# Patient Record
Sex: Male | Born: 1990 | Race: White | Hispanic: No | Marital: Married | Smoking: Never smoker
Health system: Southern US, Community
[De-identification: ages and names within clinical notes are randomized; demographics above are authoritative.]

---

## 2019-02-25 ENCOUNTER — Other Ambulatory Visit: Payer: Self-pay | Admitting: Family Medicine

## 2019-02-25 ENCOUNTER — Other Ambulatory Visit: Payer: Self-pay

## 2019-02-25 ENCOUNTER — Ambulatory Visit: Payer: Self-pay

## 2019-02-25 DIAGNOSIS — Z0000 Encounter for general adult medical examination without abnormal findings: Secondary | ICD-10-CM

## 2019-12-08 DIAGNOSIS — Z0001 Encounter for general adult medical examination with abnormal findings: Secondary | ICD-10-CM | POA: Diagnosis not present

## 2020-07-21 DIAGNOSIS — S61212A Laceration without foreign body of right middle finger without damage to nail, initial encounter: Secondary | ICD-10-CM | POA: Diagnosis not present

## 2020-07-21 DIAGNOSIS — K219 Gastro-esophageal reflux disease without esophagitis: Secondary | ICD-10-CM | POA: Diagnosis not present

## 2020-07-21 DIAGNOSIS — Z0000 Encounter for general adult medical examination without abnormal findings: Secondary | ICD-10-CM | POA: Diagnosis not present

## 2020-07-21 DIAGNOSIS — Z23 Encounter for immunization: Secondary | ICD-10-CM | POA: Diagnosis not present

## 2020-07-21 DIAGNOSIS — Z0289 Encounter for other administrative examinations: Secondary | ICD-10-CM | POA: Diagnosis not present

## 2020-07-26 DIAGNOSIS — Z111 Encounter for screening for respiratory tuberculosis: Secondary | ICD-10-CM | POA: Diagnosis not present

## 2020-08-10 DIAGNOSIS — Z111 Encounter for screening for respiratory tuberculosis: Secondary | ICD-10-CM | POA: Diagnosis not present

## 2020-09-14 DIAGNOSIS — Z23 Encounter for immunization: Secondary | ICD-10-CM | POA: Diagnosis not present

## 2020-10-13 DIAGNOSIS — Z23 Encounter for immunization: Secondary | ICD-10-CM | POA: Diagnosis not present

## 2021-04-11 DIAGNOSIS — Z0000 Encounter for general adult medical examination without abnormal findings: Secondary | ICD-10-CM | POA: Diagnosis not present

## 2021-04-11 DIAGNOSIS — E785 Hyperlipidemia, unspecified: Secondary | ICD-10-CM | POA: Diagnosis not present

## 2021-04-13 DIAGNOSIS — Z0001 Encounter for general adult medical examination with abnormal findings: Secondary | ICD-10-CM | POA: Diagnosis not present

## 2021-04-13 DIAGNOSIS — Z23 Encounter for immunization: Secondary | ICD-10-CM | POA: Diagnosis not present

## 2021-06-15 DIAGNOSIS — J069 Acute upper respiratory infection, unspecified: Secondary | ICD-10-CM | POA: Diagnosis not present

## 2021-06-29 ENCOUNTER — Other Ambulatory Visit: Payer: Self-pay

## 2021-06-29 ENCOUNTER — Emergency Department (HOSPITAL_BASED_OUTPATIENT_CLINIC_OR_DEPARTMENT_OTHER): Payer: BC Managed Care – PPO

## 2021-06-29 ENCOUNTER — Emergency Department (HOSPITAL_BASED_OUTPATIENT_CLINIC_OR_DEPARTMENT_OTHER)
Admission: EM | Admit: 2021-06-29 | Discharge: 2021-06-29 | Disposition: A | Discharge status: Home / Self Care | Payer: BC Managed Care – PPO | Attending: Emergency Medicine | Admitting: Emergency Medicine

## 2021-06-29 ENCOUNTER — Encounter (HOSPITAL_BASED_OUTPATIENT_CLINIC_OR_DEPARTMENT_OTHER): Payer: Self-pay | Admitting: Radiology

## 2021-06-29 DIAGNOSIS — K529 Noninfective gastroenteritis and colitis, unspecified: Secondary | ICD-10-CM | POA: Insufficient documentation

## 2021-06-29 DIAGNOSIS — R1110 Vomiting, unspecified: Secondary | ICD-10-CM | POA: Diagnosis not present

## 2021-06-29 DIAGNOSIS — R1031 Right lower quadrant pain: Secondary | ICD-10-CM | POA: Diagnosis not present

## 2021-06-29 LAB — CBC WITH DIFFERENTIAL/PLATELET
Abs Immature Granulocytes: 0.08 10*3/uL — ABNORMAL HIGH (ref 0.00–0.07)
Basophils Absolute: 0.1 10*3/uL (ref 0.0–0.1)
Basophils Relative: 0 %
Eosinophils Absolute: 0 10*3/uL (ref 0.0–0.5)
Eosinophils Relative: 0 %
HCT: 48.6 % (ref 39.0–52.0)
Hemoglobin: 16.6 g/dL (ref 13.0–17.0)
Immature Granulocytes: 1 %
Lymphocytes Relative: 4 %
Lymphs Abs: 0.7 10*3/uL (ref 0.7–4.0)
MCH: 28.7 pg (ref 26.0–34.0)
MCHC: 34.2 g/dL (ref 30.0–36.0)
MCV: 84.1 fL (ref 80.0–100.0)
Monocytes Absolute: 0.9 10*3/uL (ref 0.1–1.0)
Monocytes Relative: 6 %
Neutro Abs: 14.6 10*3/uL — ABNORMAL HIGH (ref 1.7–7.7)
Neutrophils Relative %: 89 %
Platelets: 345 10*3/uL (ref 150–400)
RBC: 5.78 MIL/uL (ref 4.22–5.81)
RDW: 12.5 % (ref 11.5–15.5)
WBC: 16.3 10*3/uL — ABNORMAL HIGH (ref 4.0–10.5)
nRBC: 0 % (ref 0.0–0.2)

## 2021-06-29 LAB — COMPREHENSIVE METABOLIC PANEL
ALT: 29 U/L (ref 0–44)
AST: 27 U/L (ref 15–41)
Albumin: 4.7 g/dL (ref 3.5–5.0)
Alkaline Phosphatase: 61 U/L (ref 38–126)
Anion gap: 10 (ref 5–15)
BUN: 16 mg/dL (ref 6–20)
CO2: 25 mmol/L (ref 22–32)
Calcium: 9.3 mg/dL (ref 8.9–10.3)
Chloride: 102 mmol/L (ref 98–111)
Creatinine, Ser: 1.06 mg/dL (ref 0.61–1.24)
GFR, Estimated: >60 mL/min (ref >60)
Glucose, Bld: 136 mg/dL — ABNORMAL HIGH (ref 70–99)
Potassium: 4.4 mmol/L (ref 3.5–5.1)
Sodium: 137 mmol/L (ref 135–145)
Total Bilirubin: 0.7 mg/dL (ref 0.3–1.2)
Total Protein: 7.9 g/dL (ref 6.5–8.1)

## 2021-06-29 LAB — LIPASE, BLOOD: Lipase: 21 U/L (ref 11–51)

## 2021-06-29 MED ORDER — ONDANSETRON HCL 4 MG/2ML IJ SOLN
4.0000 mg | Freq: Once | INTRAMUSCULAR | Status: AC
  Administered 2021-06-29: 4 mg via INTRAVENOUS
  Filled 2021-06-29: qty 2

## 2021-06-29 MED ORDER — SODIUM CHLORIDE 0.9 % IV BOLUS
1000.0000 mL | Freq: Once | INTRAVENOUS | Status: AC
  Administered 2021-06-29: 1000 mL via INTRAVENOUS

## 2021-06-29 MED ORDER — ONDANSETRON 8 MG PO TBDP: 8.0000 mg | ORAL_TABLET | Freq: Three times a day (TID) | ORAL | 0 refills | Status: AC | PRN

## 2021-06-29 MED ORDER — IOHEXOL 300 MG/ML  SOLN
100.0000 mL | Freq: Once | INTRAMUSCULAR | Status: AC | PRN
  Administered 2021-06-29: 100 mL via INTRAVENOUS

## 2021-06-29 MED ORDER — FENTANYL CITRATE PF 50 MCG/ML IJ SOSY
50.0000 ug | PREFILLED_SYRINGE | Freq: Once | INTRAMUSCULAR | Status: AC
  Administered 2021-06-29: 50 ug via INTRAVENOUS
  Filled 2021-06-29: qty 1

## 2021-06-29 NOTE — ED Triage Notes (Signed)
Pt reports sudden onset of N/V/D around 2300 with associated 7/10 RLQ pain. No surgical hx. VSS, ambulatory, A&Ox3.

## 2021-06-29 NOTE — Discharge Instructions (Signed)
Take loperamide (Imodium AD) as needed for diarrhea.  Return if symptoms are getting worse. 

## 2021-06-29 NOTE — ED Provider Notes (Signed)
[facility] EMERGENCY DEPT Provider Note   CSN: [id number] Arrival date & time: 06/29/21  0217     History  Chief Complaint  Patient presents with   Abdominal Pain    Curtis Barnes is a 31 y.o. male.  The history is provided by the patient.  Abdominal Pain He has no significant past medical history and comes in because of vomiting, diarrhea, right lower quadrant pain.  He was well until about 11:30 PM when he started having diarrhea followed by vomiting.  He has to meats and episodes of emesis and needed episodes of diarrhea.  There was no blood or mucus in stool or emesis.  He denies fever or sweats but has had chills.  Over the last 2 hours, he has developed right lower quadrant pain which is moderately severe.  He rates it at 6/10.  Pain does not radiate.  He did notice that pain was worse if the car hit a pothole while driving to the hospital.   Home Medications Prior to Admission medications   Not on File      Allergies    Patient has no known allergies.    Review of Systems   Review of Systems  Gastrointestinal:  Positive for abdominal pain.  All other systems reviewed and are negative.  Physical Exam Updated Vital Signs BP 116/79    Pulse 99    Temp 98.3 F (36.8 C) (Oral)    Resp 15    Ht 5\' 10"  (1.778 m)    Wt 83.9 kg    SpO2 96%    BMI 26.54 kg/m  Physical Exam Vitals and nursing note reviewed.  31 year old male, resting comfortably and in no acute distress. Vital signs are normal. Oxygen saturation is 96%, which is normal. Head is normocephalic and atraumatic. PERRLA, EOMI. Oropharynx is clear. Neck is nontender and supple without adenopathy or JVD. Back is nontender and there is no CVA tenderness. Lungs are clear without rales, wheezes, or rhonchi. Chest is nontender. Heart has regular rate and rhythm without murmur. Abdomen is soft, flat, with moderate right lower quadrant tenderness.  There is no rebound or guarding.  There are no masses or  hepatosplenomegaly and peristalsis is hypoactive. Extremities have no cyanosis or edema, full range of motion is present. Skin is warm and dry without rash. Neurologic: Mental status is normal, cranial nerves are intact, there are no motor or sensory deficits.  ED Results / Procedures / Treatments   Labs (all labs ordered are listed, but only abnormal results are displayed) Labs Reviewed  CBC WITH DIFFERENTIAL/PLATELET - Abnormal; Notable for the following components:      Result Value   WBC 16.3 (*)    Neutro Abs 14.6 (*)    Abs Immature Granulocytes 0.08 (*)    All other components within normal limits  COMPREHENSIVE METABOLIC PANEL - Abnormal; Notable for the following components:   Glucose, Bld 136 (*)    All other components within normal limits  LIPASE, BLOOD   Radiology CT ABDOMEN PELVIS W CONTRAST  Result Date: 06/29/2021 CLINICAL DATA:  31 year old male with sudden onset nausea vomiting and diarrhea 2300 hours with right lower quadrant pain. EXAM: CT ABDOMEN AND PELVIS WITH CONTRAST TECHNIQUE: Multidetector CT imaging of the abdomen and pelvis was performed using the standard protocol following bolus administration of intravenous contrast. CONTRAST:  26 OMNIPAQUE IOHEXOL 300 MG/ML  SOLN COMPARISON:  Chest radiographs 02/25/2019. FINDINGS: Lower chest: Minor lung base atelectasis, otherwise negative. Hepatobiliary: Negative  liver and gallbladder. Pancreas: Negative. Spleen: Small round partially exophytic low-density lesion of the superior pole of the spleen best seen on coronal image 34 has simple fluid density. This measures about 11 mm. Favor benign etiology such as hemangioma or cyst. Otherwise the spleen is within normal limits. Adrenals/Urinary Tract: Normal adrenal glands. Kidneys appears symmetric, nonobstructed and without pararenal inflammation. No nephrolithiasis. Decompressed ureters. Unremarkable bladder. There are few phleboliths in the deep left pelvis. Stomach/Bowel:  Fluid in nondilated small and large bowel loops throughout the abdomen and pelvis. Fluid in the colon to the level of the rectum. Normal appendix on series 2, image 67. No discrete large or small bowel inflammation. Unremarkable stomach and duodenum. No free air or free fluid. Vascular/Lymphatic: Major arterial structures appear patent and normal. Portal venous system is patent. No lymphadenopathy. Reproductive: Negative. Other: No pelvic free fluid. Musculoskeletal: Slight dextroconvex scoliosis, otherwise negative. IMPRESSION: 1. Fluid in nondilated small and large bowel loops throughout the abdomen and pelvis compatible with enteritis and/or diarrhea. No discrete bowel inflammation. Normal appendix. 2. No other acute or inflammatory process identified in the abdomen or pelvis. 3. A small round low-density lesion of the superior pole of the spleen has simple fluid density and is most likely benign. Electronically Signed   By: Odessa Fleming M.D.   On: 06/29/2021 04:10    Procedures Procedures    Medications Ordered in ED Medications  sodium chloride 0.9 % bolus 1,000 mL (0 mLs Intravenous Stopped 06/29/21 0341)  ondansetron (ZOFRAN) injection 4 mg (4 mg Intravenous Given 06/29/21 0244)  fentaNYL (SUBLIMAZE) injection 50 mcg (50 mcg Intravenous Given 06/29/21 0246)  iohexol (OMNIPAQUE) 300 MG/ML solution 100 mL (100 mLs Intravenous Contrast Given 06/29/21 0350)    ED Course/ Medical Decision Making/ A&P                           Medical Decision Making  Right lower quadrant pain in the setting of nausea, vomiting, diarrhea.  Possible gastroenteritis, possible food poisoning, possible appendicitis.  Doubt pyelonephritis, urolithiasis.  He has received fentanyl and ondansetron with improvement in pain and nausea.  WBC is elevated at 16.3 with left shift, but this is nonspecific.  Chest x-ray is normal.  I have independently viewed the images and agree with radiologist interpretation.  Metabolic panel is pending.   He will be sent for CT of abdomen and pelvis.  Old records are reviewed, and he has no relevant past visits.  Metabolic panel is significant for borderline elevated glucose.  Lipase is normal.  CT of abdomen and pelvis shows normal appendix, fluid in the distal small bowel and colon consistent with gastroenteritis.  I have independently reviewed the images and agree with radiologist interpretation.  He is discharged with prescription for ondansetron oral dissolving tablet and told to use over-the-counter loperamide as needed for diarrhea.  Return precautions discussed.        Final Clinical Impression(s) / ED Diagnoses Final diagnoses:  Gastroenteritis    Rx / DC Orders ED Discharge Orders          Ordered    ondansetron (ZOFRAN-ODT) 8 MG disintegrating tablet  Every 8 hours PRN        06/29/21 0428              Dione Booze, MD 06/29/21 0430

## 2022-02-02 DIAGNOSIS — T753XXA Motion sickness, initial encounter: Secondary | ICD-10-CM | POA: Diagnosis not present

## 2022-04-12 DIAGNOSIS — E785 Hyperlipidemia, unspecified: Secondary | ICD-10-CM | POA: Diagnosis not present

## 2022-04-16 DIAGNOSIS — Z0000 Encounter for general adult medical examination without abnormal findings: Secondary | ICD-10-CM | POA: Diagnosis not present

## 2022-04-16 DIAGNOSIS — E785 Hyperlipidemia, unspecified: Secondary | ICD-10-CM | POA: Diagnosis not present

## 2022-05-22 DIAGNOSIS — L600 Ingrowing nail: Secondary | ICD-10-CM | POA: Diagnosis not present

## 2022-08-08 ENCOUNTER — Ambulatory Visit (INDEPENDENT_AMBULATORY_CARE_PROVIDER_SITE_OTHER): Payer: BC Managed Care – PPO

## 2022-08-08 ENCOUNTER — Ambulatory Visit
Admission: EM | Admit: 2022-08-08 | Discharge: 2022-08-08 | Disposition: A | Discharge status: Home / Self Care | Payer: BC Managed Care – PPO | Attending: Family Medicine | Admitting: Family Medicine

## 2022-08-08 DIAGNOSIS — R059 Cough, unspecified: Secondary | ICD-10-CM | POA: Diagnosis not present

## 2022-08-08 DIAGNOSIS — J209 Acute bronchitis, unspecified: Secondary | ICD-10-CM

## 2022-08-08 MED ORDER — ALBUTEROL SULFATE (2.5 MG/3ML) 0.083% IN NEBU
2.5000 mg | INHALATION_SOLUTION | Freq: Once | RESPIRATORY_TRACT | Status: AC
  Administered 2022-08-08: 2.5 mg via RESPIRATORY_TRACT

## 2022-08-08 MED ORDER — ALBUTEROL SULFATE HFA 108 (90 BASE) MCG/ACT IN AERS: 2.0000 | INHALATION_SPRAY | RESPIRATORY_TRACT | 0 refills | Status: AC | PRN

## 2022-08-08 MED ORDER — PREDNISONE 20 MG PO TABS
40.0000 mg | ORAL_TABLET | Freq: Every day | ORAL | 0 refills | Status: AC
Start: 2022-08-08 — End: ?

## 2022-08-08 MED ORDER — PROMETHAZINE-DM 6.25-15 MG/5ML PO SYRP: 5.0000 mL | ORAL_SOLUTION | Freq: Four times a day (QID) | ORAL | 0 refills | Status: AC | PRN

## 2022-08-08 NOTE — ED Triage Notes (Addendum)
Pt reports he has had a cough with "rales" and green mucus x 2 weeks. Took decongestants but no relief

## 2022-08-08 NOTE — ED Provider Notes (Signed)
[facility]    CSN: [id number] Arrival date & time: 08/08/22  1211      History   Chief Complaint No chief complaint on file.   HPI Curtis Barnes is a 32 y.o. male.   Patient presenting today with 2-week history of progressively worsening productive cough, chest tightness and feeling like there is crackles in his lungs.  He states they traveled to the [place] and he and his wife have both been sick with similar symptoms since returning back home.  Has tried some over-the-counter decongestants here and there with last dose being several days ago with mild relief of symptoms.  Denies any history of asthma or other chronic pulmonary conditions.    History reviewed. No pertinent past medical history.  There are no problems to display for this patient.   History reviewed. No pertinent surgical history.     Home Medications    Prior to Admission medications   Medication Sig Start Date End Date Taking? Authorizing Provider  albuterol (VENTOLIN HFA) 108 (90 Base) MCG/ACT inhaler Inhale 2 puffs into the lungs every 4 (four) hours as needed for wheezing or shortness of breath. 08/08/22  Yes Volney American, PA-C  predniSONE (DELTASONE) 20 MG tablet Take 2 tablets (40 mg total) by mouth daily with breakfast. 08/08/22  Yes Volney American, PA-C  promethazine-dextromethorphan (PROMETHAZINE-DM) 6.25-15 MG/5ML syrup Take 5 mLs by mouth 4 (four) times daily as needed. 08/08/22  Yes Volney American, PA-C  ondansetron (ZOFRAN-ODT) 8 MG disintegrating tablet Take 1 tablet (8 mg total) by mouth every 8 (eight) hours as needed for nausea or vomiting. [id number]   Delora Fuel, MD    Family History History reviewed. No pertinent family history.  Social History Social History   Tobacco Use   Smoking status: Never   Smokeless tobacco: Never  Substance Use Topics   Alcohol use: Never   Drug use: Never     Allergies   Patient has no known allergies.   Review  of Systems Review of Systems Per HPI  Physical Exam Triage Vital Signs ED Triage Vitals  Enc Vitals Group     BP 08/08/22 1334 120/79     Pulse Rate 08/08/22 1334 89     Resp 08/08/22 1334 18     Temp 08/08/22 1334 97.9 F (36.6 C)     Temp Source 08/08/22 1334 Oral     SpO2 08/08/22 1334 96 %     Weight --      Height --      Head Circumference --      Peak Flow --      Pain Score 08/08/22 1336 0     Pain Loc --      Pain Edu? --      Excl. in [place]? --    No data found.  Updated Vital Signs BP 120/79 (BP Location: Right Arm)   Pulse 94   Temp 97.9 F (36.6 C) (Oral)   Resp 18   SpO2 97%   Visual Acuity Right Eye Distance:   Left Eye Distance:   Bilateral Distance:    Right Eye Near:   Left Eye Near:    Bilateral Near:     Physical Exam Vitals and nursing note reviewed.  Constitutional:      Appearance: He is well-developed.  HENT:     Head: Atraumatic.     Right Ear: External ear normal.     Left Ear: External ear normal.  Nose: Rhinorrhea present.     Mouth/Throat:     Pharynx: No oropharyngeal exudate or posterior oropharyngeal erythema.  Eyes:     Conjunctiva/sclera: Conjunctivae normal.     Pupils: Pupils are equal, round, and reactive to light.  Cardiovascular:     Rate and Rhythm: Normal rate and regular rhythm.  Pulmonary:     Effort: Pulmonary effort is normal. No respiratory distress.     Breath sounds: Wheezing present. No rales.  Musculoskeletal:        General: Normal range of motion.     Cervical back: Normal range of motion and neck supple.  Lymphadenopathy:     Cervical: No cervical adenopathy.  Skin:    General: Skin is warm and dry.  Neurological:     Mental Status: He is alert and oriented to person, place, and time.  Psychiatric:        Behavior: Behavior normal.      UC Treatments / Results  Labs (all labs ordered are listed, but only abnormal results are displayed) Labs Reviewed - No data to  display  EKG   Radiology DG Chest 2 View  Result Date: 08/08/2022 CLINICAL DATA:  Cough worsening for 2 weeks. EXAM: CHEST - 2 VIEW COMPARISON:  Chest radiograph 02/25/2019 FINDINGS: The cardiomediastinal silhouette is normal. There is no focal consolidation or pulmonary edema. There is no pleural effusion or pneumothorax There is no acute osseous abnormality. IMPRESSION: No radiographic evidence of acute cardiopulmonary process. Electronically Signed   By: Valetta Mole M.D.   On: 08/08/2022 14:02    Procedures Procedures (including critical care time)  Medications Ordered in UC Medications  albuterol (PROVENTIL) (2.5 MG/3ML) 0.083% nebulizer solution 2.5 mg (2.5 mg Nebulization Given 08/08/22 1355)    Initial Impression / Assessment and Plan / UC Course  I have reviewed the triage vital signs and the nursing notes.  Pertinent labs & imaging results that were available during my care of the patient were reviewed by me and considered in my medical decision making (see chart for details).     Moderate benefit noted after albuterol nebulizer treatment regarding his wheezing and chest tightness, chest x-ray performed today negative for acute cardiopulmonary abnormality.  Will treat for bronchitis with prednisone, Phenergan DM, albuterol and discussed supportive over-the-counter medications and home care.  Return for any worsening symptoms.  Final Clinical Impressions(s) / UC Diagnoses   Final diagnoses:  Acute bronchitis, unspecified organism   Discharge Instructions   None    ED Prescriptions     Medication Sig Dispense Auth. Provider   predniSONE (DELTASONE) 20 MG tablet Take 2 tablets (40 mg total) by mouth daily with breakfast. 10 tablet Volney American, PA-C   promethazine-dextromethorphan (PROMETHAZINE-DM) 6.25-15 MG/5ML syrup Take 5 mLs by mouth 4 (four) times daily as needed. 100 mL Volney American, PA-C   albuterol (VENTOLIN HFA) 108 (90 Base) MCG/ACT  inhaler Inhale 2 puffs into the lungs every 4 (four) hours as needed for wheezing or shortness of breath. 18 g Volney American, [place]      PDMP not reviewed this encounter.   Volney American, [place] 08/08/22 1531

## 2023-04-08 DIAGNOSIS — E785 Hyperlipidemia, unspecified: Secondary | ICD-10-CM | POA: Diagnosis not present

## 2023-04-19 DIAGNOSIS — E785 Hyperlipidemia, unspecified: Secondary | ICD-10-CM | POA: Diagnosis not present

## 2023-04-19 DIAGNOSIS — Z800 Family history of malignant neoplasm of digestive organs: Secondary | ICD-10-CM | POA: Diagnosis not present

## 2023-04-19 DIAGNOSIS — Z0000 Encounter for general adult medical examination without abnormal findings: Secondary | ICD-10-CM | POA: Diagnosis not present

## 2023-04-19 DIAGNOSIS — Z87891 Personal history of nicotine dependence: Secondary | ICD-10-CM | POA: Diagnosis not present

## 2023-10-18 IMAGING — CT CT ABD-PELV W/ CM
2 of 4 series · 15 of 46 positions shown, 17 images · IV contrast (APPLIED)
Comparison: Chest radiographs 02/25/2019.

CLINICAL DATA: 30-year-old male with sudden onset nausea vomiting
and diarrhea 1199 hours with right lower quadrant pain.

EXAM:
CT ABDOMEN AND PELVIS WITH CONTRAST
TECHNIQUE: Multidetector CT imaging of the abdomen and pelvis was performed
using the standard protocol following bolus administration of
intravenous contrast.
CONTRAST:  100mL OMNIPAQUE IOHEXOL 300 MG/ML  SOLN

[Series 2: abd pel w · axial · 0.66mm/px · z∈[+684,+1134]mm · 12 of 100 slices shown, 14 images]
[im 5/100  soft-tissue]
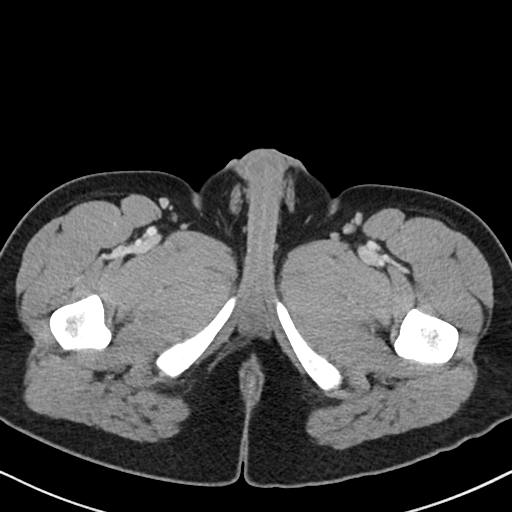
[im 5/100  bone]
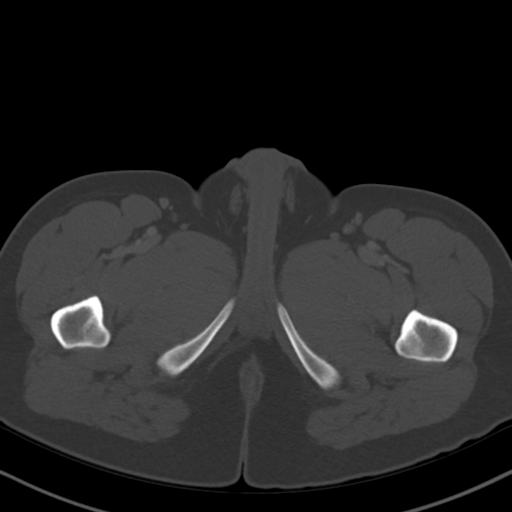
[im 13/100  soft-tissue]
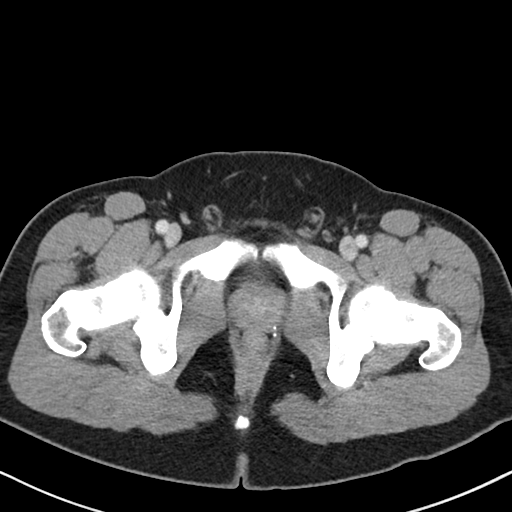
[im 21/100  soft-tissue]
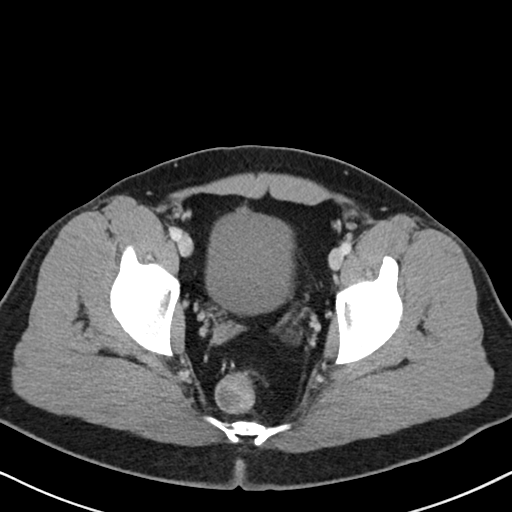
[im 29/100  soft-tissue]
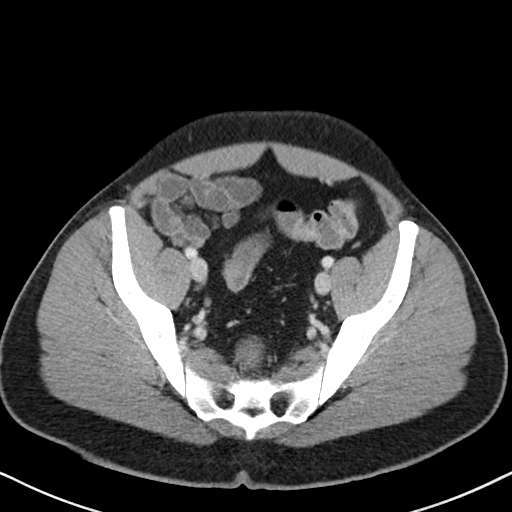
[im 38/100  soft-tissue]
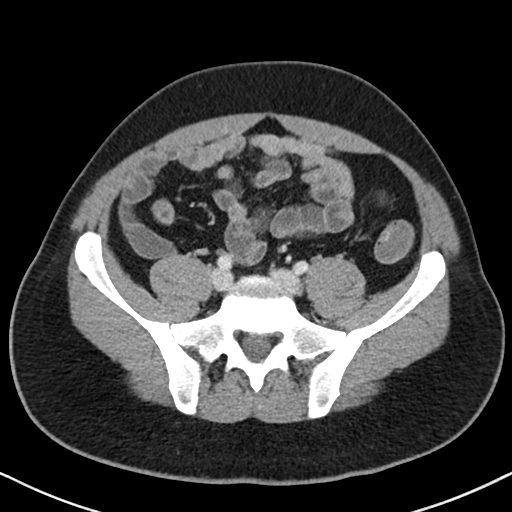
[im 46/100  soft-tissue]
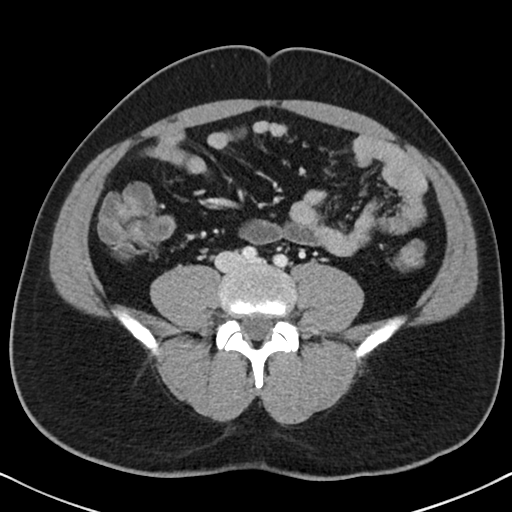
[im 54/100  soft-tissue]
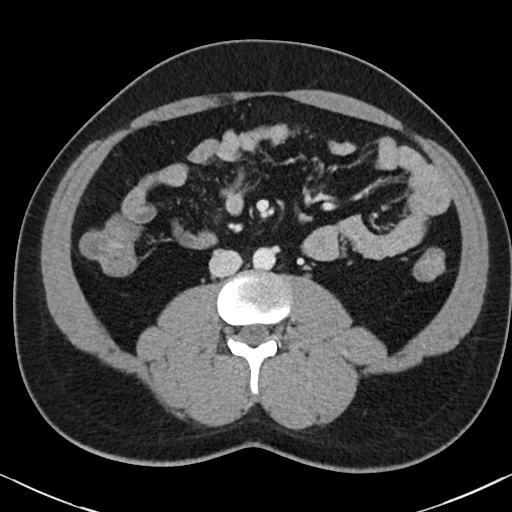
[im 62/100  soft-tissue]
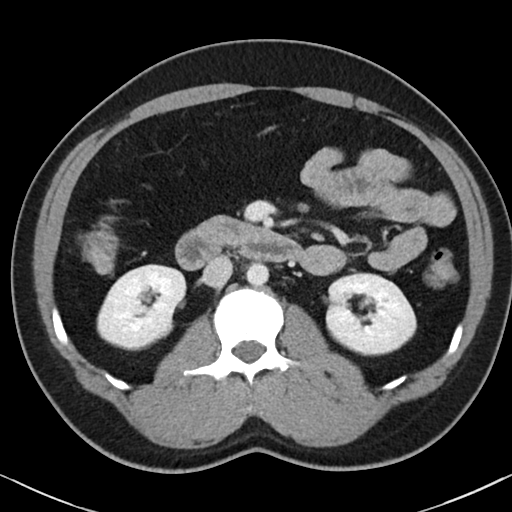
[im 71/100  soft-tissue]
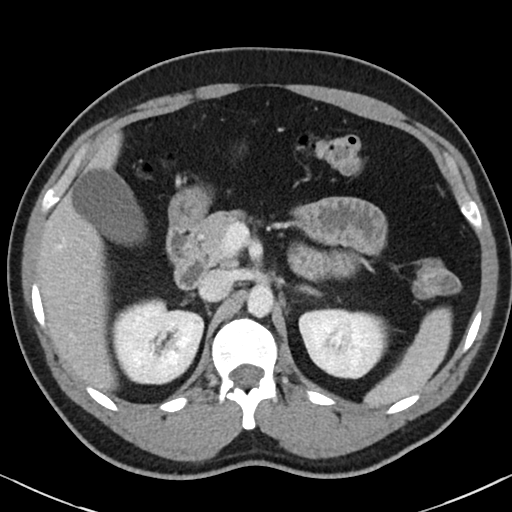
[im 71/100  bone]
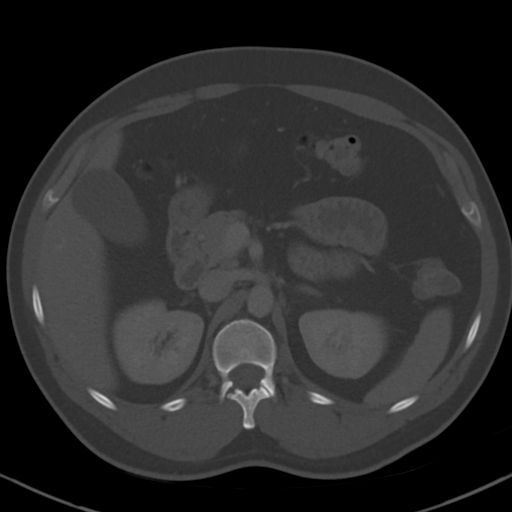
[im 79/100  soft-tissue]
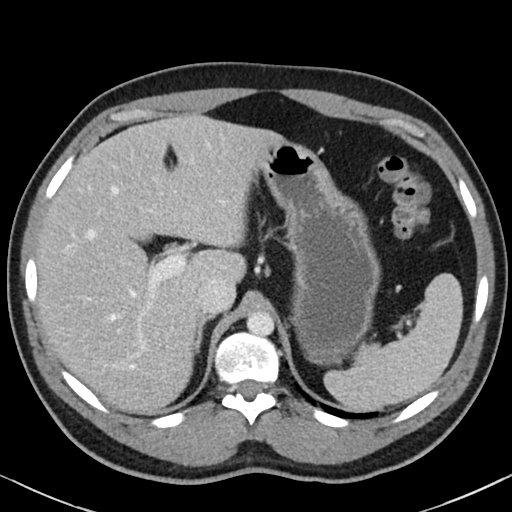
[im 87/100  soft-tissue]
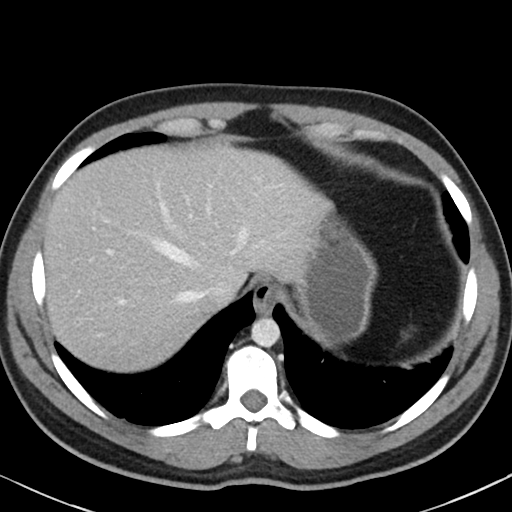
[im 95/100  soft-tissue]
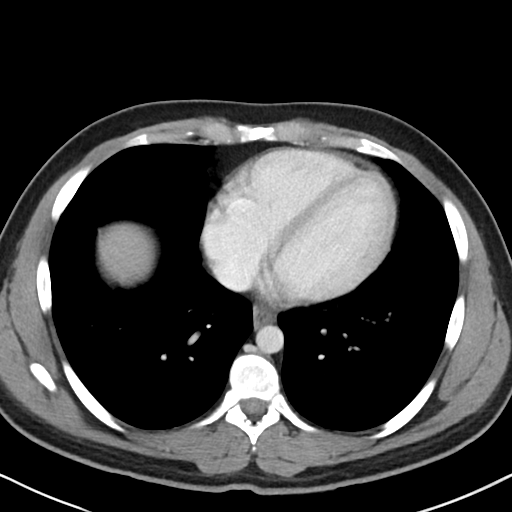

[Series 5: coronal · coronal · 0.67mm/px · 3 of 102 slices shown]
[im 34/102  soft-tissue]
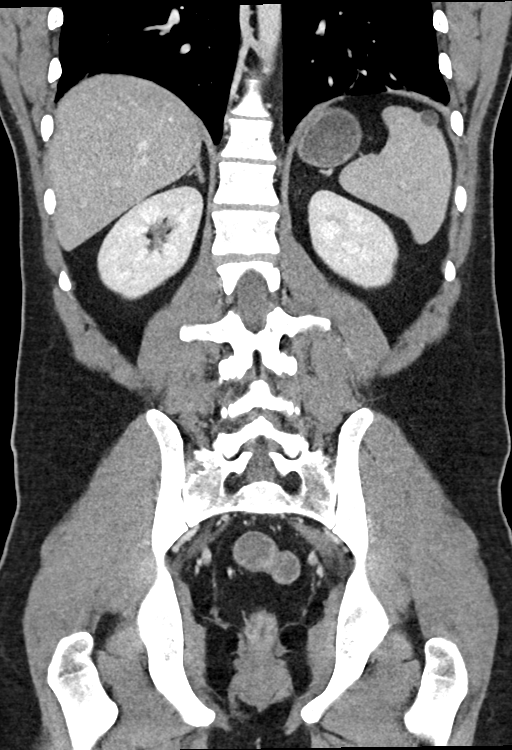
[im 45/102  soft-tissue]
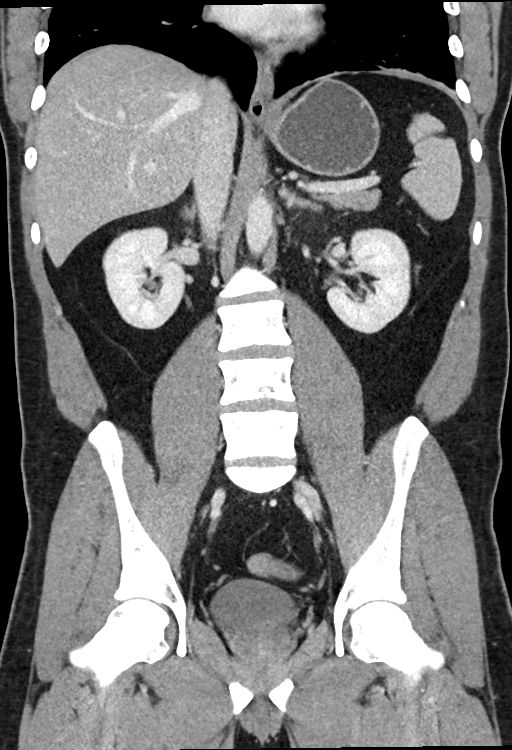
[im 57/102  soft-tissue]
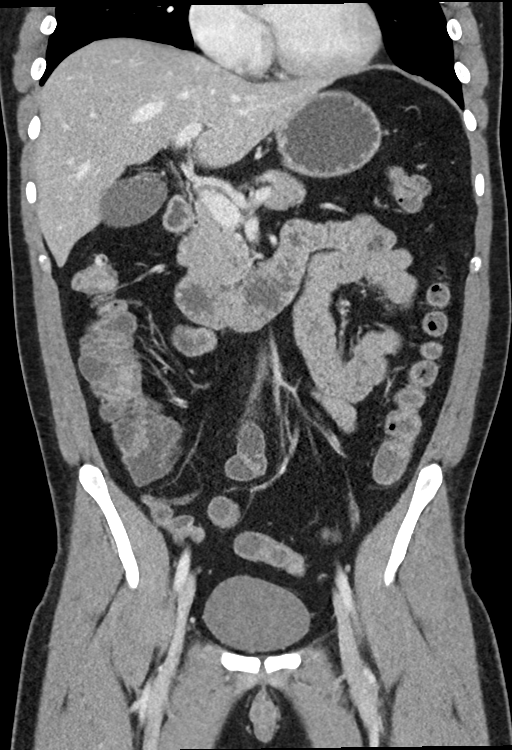

[15 of 46 positions shown; findings below may reference images not displayed]

FINDINGS: Lower chest: Minor lung base atelectasis, otherwise negative.

Hepatobiliary: Negative liver and gallbladder.

Pancreas: Negative.

Spleen: Small round partially exophytic low-density lesion of the
superior pole of the spleen best seen on coronal image 34 has simple
fluid density. This measures about 11 mm. Favor benign etiology such
as hemangioma or cyst. Otherwise the spleen is within normal limits.

Adrenals/Urinary Tract: Normal adrenal glands. Kidneys appears
symmetric, nonobstructed and without pararenal inflammation. No
nephrolithiasis. Decompressed ureters. Unremarkable bladder. There
are few phleboliths in the deep left pelvis.

Stomach/Bowel: Fluid in nondilated small and large bowel loops
throughout the abdomen and pelvis. Fluid in the colon to the level
of the rectum. Normal appendix on series 2, image 67. No discrete
large or small bowel inflammation. Unremarkable stomach and
duodenum. No free air or free fluid.

Vascular/Lymphatic: Major arterial structures appear patent and
normal. Portal venous system is patent. No lymphadenopathy.

Reproductive: Negative.

Other: No pelvic free fluid.

Musculoskeletal: Slight dextroconvex scoliosis, otherwise negative.
IMPRESSION: 1. Fluid in nondilated small and large bowel loops throughout the
abdomen and pelvis compatible with enteritis and/or diarrhea. No
discrete bowel inflammation. Normal appendix.
2. No other acute or inflammatory process identified in the abdomen
or pelvis.
3. A small round low-density lesion of the superior pole of the
spleen has simple fluid density and is most likely benign.

## 2024-04-15 DIAGNOSIS — E785 Hyperlipidemia, unspecified: Secondary | ICD-10-CM | POA: Diagnosis not present

## 2024-04-23 DIAGNOSIS — Z0000 Encounter for general adult medical examination without abnormal findings: Secondary | ICD-10-CM | POA: Diagnosis not present

## 2024-04-23 DIAGNOSIS — E785 Hyperlipidemia, unspecified: Secondary | ICD-10-CM | POA: Diagnosis not present

## 2024-04-23 DIAGNOSIS — Z800 Family history of malignant neoplasm of digestive organs: Secondary | ICD-10-CM | POA: Diagnosis not present

## 2024-04-23 DIAGNOSIS — Z23 Encounter for immunization: Secondary | ICD-10-CM | POA: Diagnosis not present
